# Patient Record
Sex: Male | Born: 1995 | Hispanic: Yes | Marital: Single | Smoking: Never smoker
Health system: Southern US, Community
[De-identification: ages and names within clinical notes are randomized; demographics above are authoritative.]

## PROBLEM LIST (undated history)

## (undated) HISTORY — PX: MENISCUS REPAIR: SHX5179

---
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
## 2014-05-20 ENCOUNTER — Emergency Department: Payer: Self-pay | Admitting: Emergency Medicine

## 2015-02-17 ENCOUNTER — Emergency Department: Payer: Self-pay | Admitting: Emergency Medicine

## 2015-03-31 NOTE — Op Note (Signed)
PATIENT NAME:  Gregory StandsCALVO, Gregory Mullins MR#:  [id number] DATE OF BIRTH:  1996/06/28  DATE OF PROCEDURE:  01/14/2013  PREOPERATIVE DIAGNOSIS:  Bucket handle tear, lateral meniscus, right knee.  POSTOPERATIVE DIAGNOSIS:  Bucket handle tear, lateral meniscus, right knee.   PROCEDURE PERFORMED:  Arthroscopic lateral meniscal repair.   SURGEON:  Murlean HarkShalini Caileigh Canche, M.D.   ASSISTANT:  None.   TOURNIQUET TIME:  0.   ANESTHESIA:  General.   COMPLICATIONS:  No immediate postoperative complications were noted.   DISPOSITION:  Stable.  Transferred to the postoperative care unit.  Nonweightbearing.  Brace locked in extension.   FINDINGS:  Arthroscopic evaluation of the right knee noted excellent cartilage preservation in the patellofemoral joint and the medial compartment.  There was very mild cartilage wear in the lateral compartment directly in line with the split portion of the bucket handle meniscus tear.  The lateral meniscus had a large bucket handle meniscal tear extending from the posterior horn laterally two-thirds of the way around the meniscus.  The meniscus was perched in the femoral notch.   INDICATIONS FOR PROCEDURE:  The patient is a 19 year old young man who was playing with his dog several weeks ago and sustained a twisting injury.  He states that his knee swelled immediately and he was unable to straighten it all the way.  He presented to my office three weeks later for intervention.  An MRI was performed revealing that the patient did have a bucket handle meniscus tear of the lateral meniscus with little residual meniscus left at the periphery.  The tear appeared to be in the red-red zone on the MRI.  Discussion was had with the patient  and his mother about surgical intervention.  Risks and benefits of surgery including the possibility of failure to heal through the meniscal repair were explained.  Expected course of rehabilitation and need for complete compliance was explained.  They agreed and  decided to proceed with surgical intervention.   DESCRIPTION OF PROCEDURE:  The patient is a 19 year old gentleman.  He was identified in the preoperative holding area.  His right lower extremity was marked as the operative site.   The patient was brought into the operating room and placed on the table in a supine position.  General anesthesia was administered.  Right lower extremity was prepared and draped in the usual sterile fashion.  Surgical timeout was performed identifying the patient, procedure, confirming consent, confirming images, and confirming that preoperative antibiotics had been administered.   Patella and patella tendon were marked.  A standard lateral viewing portal was made.  An arthroscope was inserted into the patellofemoral compartment.  The patella tracked centrally within the trochlear groove.  There was no evidence of degenerative changes.  There was no evidence of a synovial fold or large plica.  The medial and lateral gutters were examined and no loose bodies were found.   The arthroscope was now inserted into the medial compartment.  Under direct visualization a medial portal was made.  A probe was inserted through the medial portal and the medial meniscus was probed.  It was found to be quite stable with no evidence of any tears.  The femoral condyle and tibial plateau in the medial compartment were without evidence of cartilage lesions.   The attention is now turned to the notch.  ACL and PCL were identified.  They were probed and found to be quite stable.   Attention is now turned to the lateral compartment.  Blocking entry into the lateral  compartment was the posterior horn and the body of the lateral meniscus.  This had flipped into the lateral aspect of the notch.  Using a blunt trocar through the medial portal, the meniscus is able to be reduced.  The tibial plateau and lateral femoral condyle were examined and there was found to be some wear directly in line with the  area of the bucket handle tear.   The meniscus was again displaced and attention was turned to the capsular attachment.  There was nearly no meniscal tissue left attached in the area of the bucket handle tear.  A meniscal rasp was inserted and the capsular attachment and residual portion of the meniscus were rasped to achieve a bleeding border.  A spinal needle was also used to poke some holes to stimulate further bleeding.  When bleeding was confirmed we proceeded with fixation of the meniscus.   The meniscus easily reduced.  Starting at the posterior aspect and moving laterally towards the body of the meniscus, 5 separate Smith and Nephew fast (Dictation Anomaly) fix 360 meniscal repair tacks were inserted, each placed in a horizontal mattress fashion.  Two sutures were placed on the undersurface of the meniscus and 3 were placed on the top surface of the meniscus.  After placement of all tacks the meniscus was probed along the entire course of the tear and found to be incredibly stable.  At this time the knee was taken through range of motion from 0 to 90 degrees and the meniscus was found to be stable in place.   The knee was drained.  0.25% Marcaine was injected into the portals.  Portals were closed using 3-0 nylon suture.  Sterile dressings were applied.  The patient was placed in a hinged knee brace locked in extension.  TENS unit leads were applied and a Polar Care was applied.   The patient will be nonweightbearing with his leg locked in extension.  He will follow up on Monday.  At the follow-up appointment we will set him up to see physical therapy.  At that time we will begin a rehab protocol which will progress his weight-bearing and his motion.     ____________________________ Murlean Hark, MD sr:ea D: 01/14/2013 15:55:07 ET T: 01/15/2013 02:08:14 ET JOB#: [id number]  cc: Murlean Hark, MD, <Dictator> Murlean Hark MD ELECTRONICALLY SIGNED 02/16/2013 11:46

## 2015-06-09 IMAGING — CT CT ABD-PELV W/O CM
1 of 4 series · 6 of 46 positions shown, 11 images · non-contrast
Comparison: None.

CLINICAL DATA: Dysuria, with mild hematuria, acute onset. Initial
encounter.

EXAM:
CT ABDOMEN AND PELVIS WITHOUT CONTRAST
TECHNIQUE: Multidetector CT imaging of the abdomen and pelvis was performed
following the standard protocol without IV contrast.

[Series 4: lung windows · axial · 0.66mm/px · z∈[-243,-128]mm · 6 of 33 slices shown, 11 images]
[im 5/33  soft-tissue]
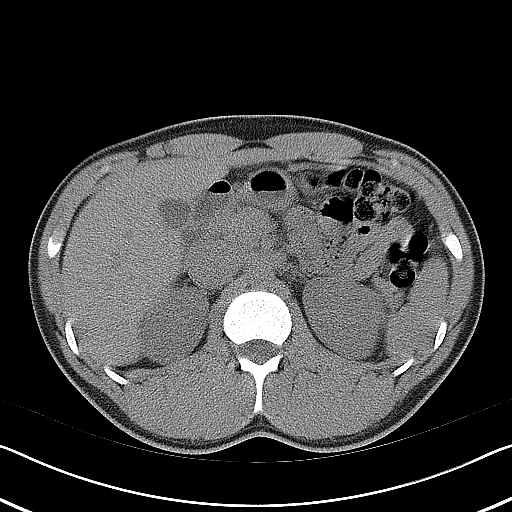
[im 5/33  bone]
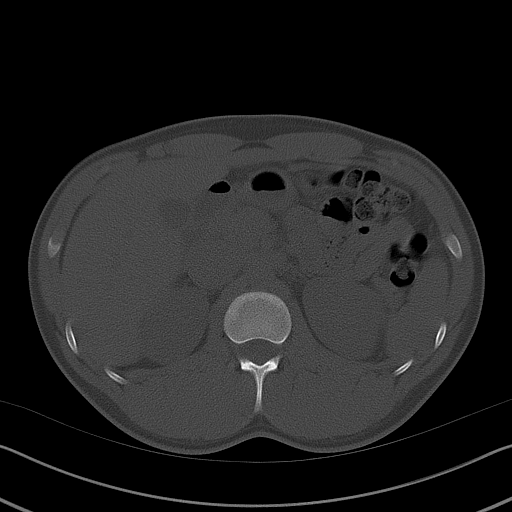
[im 10/33  soft-tissue]
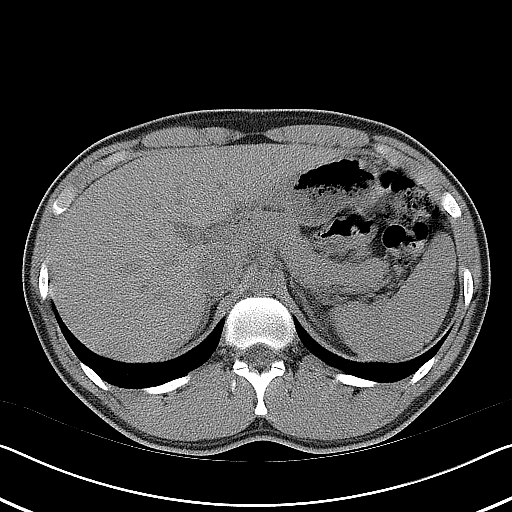
[im 14/33  soft-tissue]
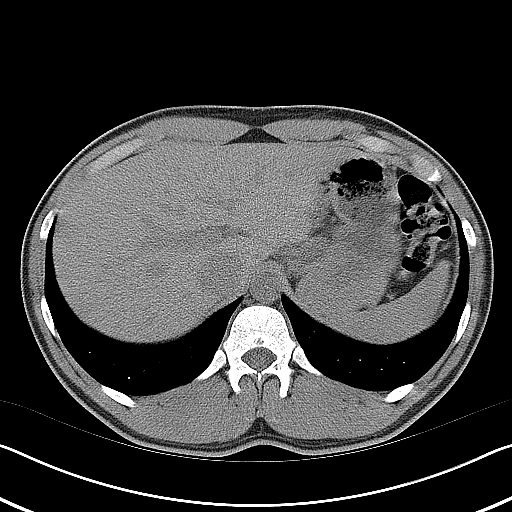
[im 14/33  lung]
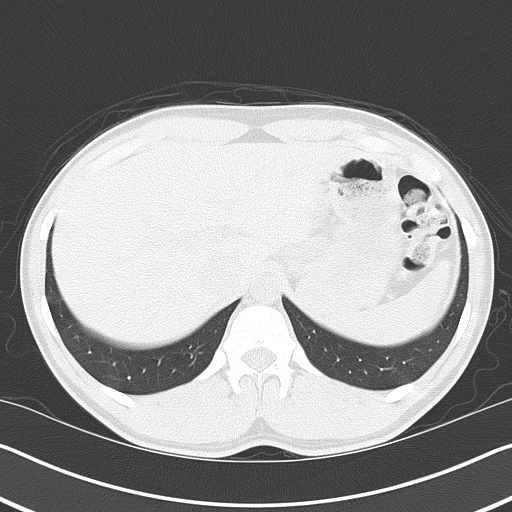
[im 19/33  soft-tissue]
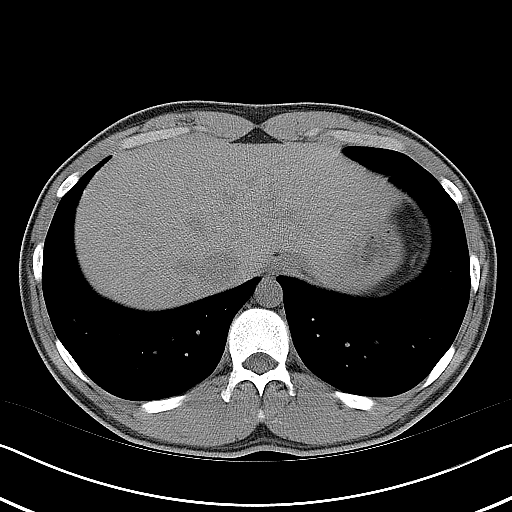
[im 19/33  lung]
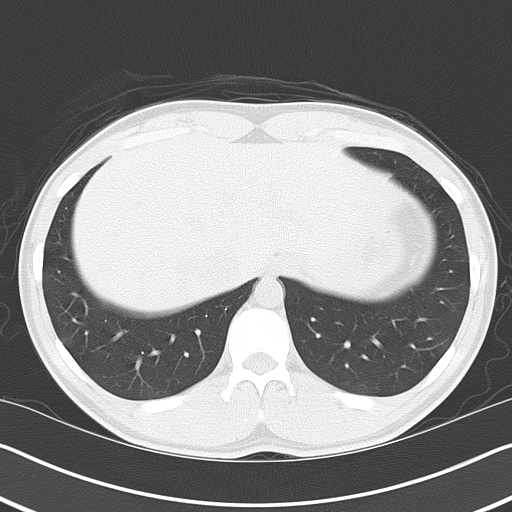
[im 23/33  soft-tissue]
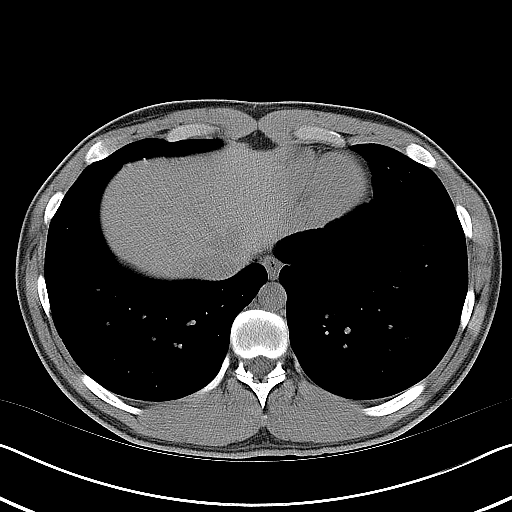
[im 23/33  lung]
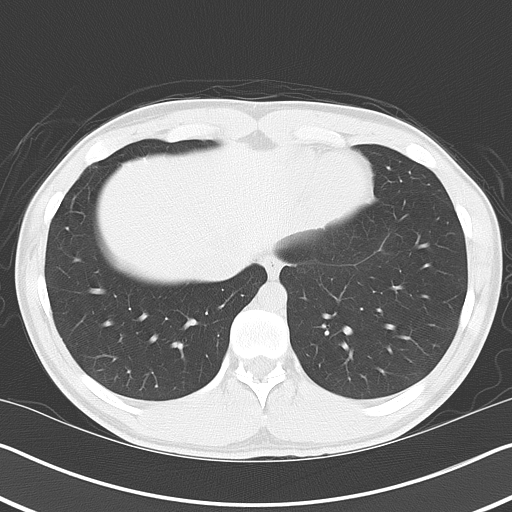
[im 28/33  soft-tissue]
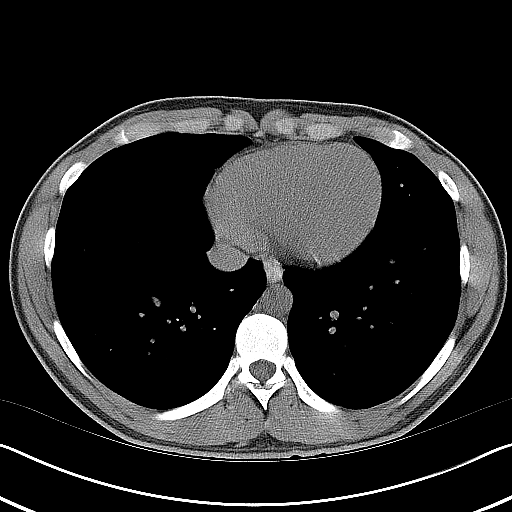
[im 28/33  lung]
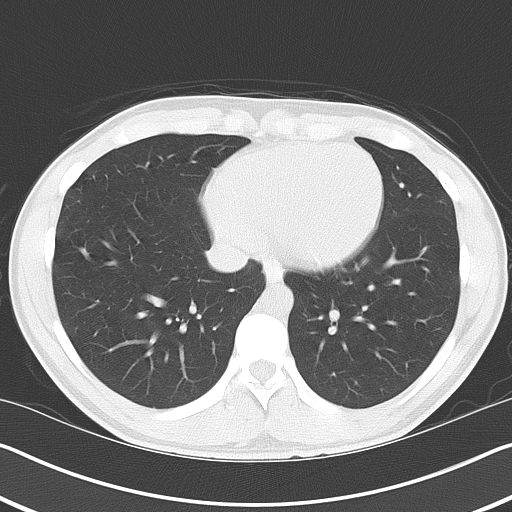

[6 of 46 positions shown; findings below may reference images not displayed]

FINDINGS: The visualized lung bases are clear.

The liver and spleen are unremarkable in appearance. The gallbladder
is within normal limits. The pancreas and adrenal glands are
unremarkable.

The kidneys are unremarkable in appearance. There is no evidence of
hydronephrosis. No renal or ureteral stones are seen. No perinephric
stranding is appreciated.

No free fluid is identified. The small bowel is unremarkable in
appearance. The stomach is within normal limits. No acute vascular
abnormalities are seen.

The appendix is noted on coronal images and is unremarkable in
appearance, without evidence for appendicitis. The colon is
unremarkable in appearance.

The bladder is mildly distended and grossly unremarkable. The
prostate is borderline normal in size. No inguinal lymphadenopathy
is seen.

No acute osseous abnormalities are identified.
IMPRESSION: Unremarkable noncontrast CT of the abdomen and pelvis.

## 2017-05-09 ENCOUNTER — Emergency Department: Payer: Self-pay

## 2017-05-09 ENCOUNTER — Emergency Department
Admission: EM | Admit: 2017-05-09 | Discharge: 2017-05-09 | Disposition: A | Discharge status: Home / Self Care | Payer: Self-pay | Attending: Emergency Medicine | Admitting: Emergency Medicine

## 2017-05-09 ENCOUNTER — Encounter: Payer: Self-pay | Admitting: Emergency Medicine

## 2017-05-09 DIAGNOSIS — Y998 Other external cause status: Secondary | ICD-10-CM | POA: Insufficient documentation

## 2017-05-09 DIAGNOSIS — Y929 Unspecified place or not applicable: Secondary | ICD-10-CM | POA: Insufficient documentation

## 2017-05-09 DIAGNOSIS — Y9389 Activity, other specified: Secondary | ICD-10-CM | POA: Insufficient documentation

## 2017-05-09 DIAGNOSIS — S0502XA Injury of conjunctiva and corneal abrasion without foreign body, left eye, initial encounter: Secondary | ICD-10-CM | POA: Insufficient documentation

## 2017-05-09 DIAGNOSIS — T1592XA Foreign body on external eye, part unspecified, left eye, initial encounter: Secondary | ICD-10-CM

## 2017-05-09 DIAGNOSIS — X58XXXA Exposure to other specified factors, initial encounter: Secondary | ICD-10-CM | POA: Insufficient documentation

## 2017-05-09 MED ORDER — FLUORESCEIN SODIUM 0.6 MG OP STRP
1.0000 | ORAL_STRIP | Freq: Once | OPHTHALMIC | Status: AC
  Administered 2017-05-09: 1 via OPHTHALMIC
  Filled 2017-05-09: qty 1

## 2017-05-09 MED ORDER — OFLOXACIN 0.3 % OP SOLN: 1.0000 [drp] | Freq: Four times a day (QID) | OPHTHALMIC | 0 refills | Status: AC

## 2017-05-09 MED ORDER — KETOROLAC TROMETHAMINE 0.4 % OP SOLN: 1.0000 [drp] | Freq: Four times a day (QID) | OPHTHALMIC | 0 refills | Status: DC

## 2017-05-09 MED ORDER — TETRACAINE HCL 0.5 % OP SOLN
1.0000 [drp] | Freq: Once | OPHTHALMIC | Status: AC
  Administered 2017-05-09: 1 [drp] via OPHTHALMIC
  Filled 2017-05-09: qty 2

## 2017-05-09 NOTE — ED Triage Notes (Addendum)
States was working yesterday at [employer], and screwing in metal screws when a piece of metal got into left eye.  C/O discomfort when blinking.  Sclera reddened to left eye.  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

## 2017-05-09 NOTE — ED Provider Notes (Addendum)
[facility] Emergency Department Provider Note   ____________________________________________   I have reviewed the triage vital signs and the nursing notes.   HISTORY  Chief Complaint Eye Injury    HPI Gregory Mullins is a 21 y.o. male presents with left eye redness and difficulty opening his eye since yesterday when he was attempting to screw a metal screw into a metal frame. He reports possibly metal shavings going into his eye. Patient reports normal visual acuity with irritation and a sense of something in his eye since the injury. He notes some light sensitivity. Patient denies a past history of foreign bodies in the eye.   History reviewed. No pertinent past medical history.  There are no active problems to display for this patient.   History reviewed. No pertinent surgical history.  Prior to Admission medications   Medication Sig Start Date End Date Taking? Authorizing Provider  ketorolac (ACULAR) 0.4 % SOLN Place 1 drop into the left eye 4 (four) times daily. 05/09/17   Mallory Enriques M, PA-C  ofloxacin (OCUFLOX) 0.3 % ophthalmic solution Place 1 drop into the left eye 4 (four) times daily. 05/09/17 05/16/17  Cordaro Mukai, Jordan Likes, PA-C    Allergies Patient has no known allergies.  No family history on file.  Social History Social History  Substance Use Topics  . Smoking status: Never Smoker  . Smokeless tobacco: Never Used  . Alcohol use Not on file    Review of Systems Constitutional: Negative for fever/chills Eyes: Left eye redness, Mild watery eye, sense of foreign body in eye. Light sensitivity in the left eye. Cardiovascular: Denies chest pain. Respiratory: Denies cough Denies shortness of breath. Skin: Negative for rash. Neurological: Negative for headaches.  ____________________________________________   PHYSICAL EXAM:  VITAL SIGNS: ED Triage Vitals  Enc Vitals Group     BP 05/09/17 1550 113/63     Pulse Rate 05/09/17 1550 60    Resp 05/09/17 1550 16     Temp 05/09/17 1550 98.2 F (36.8 C)     Temp Source 05/09/17 1550 Oral     SpO2 05/09/17 1550 99 %     Weight 05/09/17 1551 180 lb (81.6 kg)     Height 05/09/17 1551 5\' 10"  (1.778 m)     Head Circumference --      Peak Flow --      Pain Score 05/09/17 1550 6     Pain Loc --      Pain Edu? --      Excl. in GC? --     Constitutional: Alert and oriented. Well appearing and in no acute distress.  Head: Normocephalic and atraumatic. Eyes: Subconjunctival hemorrhage. PERRL. Normal extraocular movements. Normal visual acuity. Light sensitivity. Cardiovascular: Normal rate, regular rhythm. Normal distal pulses. Respiratory: Normal respiratory effort.  Neurologic: Normal speech and language. No gross focal neurologic deficits are appreciated. Skin:  Skin is warm, dry and intact. No rash noted. Psychiatric: Mood and affect are normal.  ____________________________________________   LABS (all labs ordered are listed, but only abnormal results are displayed)  Labs Reviewed - No data to display ____________________________________________  EKG none ____________________________________________  RADIOLOGY  FINDINGS: There is no evidence of metallic foreign body within the orbits. No significant bone abnormality identified.  IMPRESSION: No evidence of metallic foreign body within the orbits. ____________________________________________   PROCEDURES  Procedure(s) performed:  Fluorescein stain eye exam performed following physical exam:  Performed by: Clois Comber Authorized by: Clois Comber Consent: Verbal consent  obtained. Risks and benefits: risks, benefits and alternatives were discussed Consent given by: patient Irrigated Left eye with Eye Stream eye wash.  (1) drop Tetracaine instilled followed by instillation of fluorescein dye with fluorescein strip.  Examination with [employer] slit lamp performed: Small corneal abrasion noted at 3:00  position of left eye. No other defects noted.  Following the exam:  Left eye irrigated with Eye Stream and (1) drop of Tetracaine instilled.   Patient tolerance: Patient tolerated the procedure well with no immediate complications    Critical Care performed: no ____________________________________________   INITIAL IMPRESSION / ASSESSMENT AND PLAN / ED COURSE  Pertinent labs & imaging results that were available during my care of the patient were reviewed by me and considered in my medical decision making (see chart for details).  Patient presented with subconjunctival hemorrhage, eye watering and irritation secondary to possible foreign body in the left eye. Physical exam findings, fluorescein staining and imaging were reassuring of no foreign bodies. Fluorescein staining visualized small corneal abrasion approximately 3:00. Patient will be prescribed Ocuflox and Acular eyedrops. He be given a referral to follow up with ophthalmology. Patient / Family informed of clinical course, understand medical decision-making process, and agree with plan.  Patient was advised to follow up with ophthalmology and was also advised to return to the emergency department for symptoms that change or worsen.      ____________________________________________   FINAL CLINICAL IMPRESSION(S) / ED DIAGNOSES  Final diagnoses:  Abrasion of left cornea, initial encounter  Foreign body, eye, left, initial encounter       NEW MEDICATIONS STARTED DURING THIS VISIT:  Discharge Medication List as of 05/09/2017  6:05 PM    START taking these medications   Details  ketorolac (ACULAR) 0.4 % SOLN Place 1 drop into the left eye 4 (four) times daily., Starting Fri 05/09/2017, Print    ofloxacin (OCUFLOX) 0.3 % ophthalmic solution Place 1 drop into the left eye 4 (four) times daily., Starting Fri 05/09/2017, Until Fri 05/16/2017, Print         Note:  This document was prepared using Dragon voice recognition  software and may include unintentional dictation errors.   Maurio Baize, Karl Pockraci M, PA-C 05/09/17 2200    Jene EveryKinner, Robert, MD 05/09/17 2233    Clois ComberLittle, Jerrilynn Mikowski M, PA-C 05/09/17 [place]le [id number]    Jene EveryKinner, Robert, MD 05/09/17 2240

## 2018-12-05 ENCOUNTER — Other Ambulatory Visit: Payer: Self-pay

## 2018-12-05 ENCOUNTER — Emergency Department: Payer: Self-pay

## 2018-12-05 ENCOUNTER — Encounter: Payer: Self-pay | Admitting: Emergency Medicine

## 2018-12-05 DIAGNOSIS — J40 Bronchitis, not specified as acute or chronic: Secondary | ICD-10-CM | POA: Insufficient documentation

## 2018-12-05 DIAGNOSIS — H6993 Unspecified Eustachian tube disorder, bilateral: Secondary | ICD-10-CM | POA: Insufficient documentation

## 2018-12-05 NOTE — ED Triage Notes (Signed)
Pt reports 5 days of productive cough, body aches and headache; says he's had pneumonia several times; pt says a few days ago he had a sore throat but that seems to be a little better; pt says he's taken theraflu, mucinex, and other otc meds with no relief; pt talking in complete coherent sentences;

## 2018-12-06 ENCOUNTER — Emergency Department
Admission: EM | Admit: 2018-12-06 | Discharge: 2018-12-06 | Disposition: A | Discharge status: Home / Self Care | Payer: Self-pay | Attending: Emergency Medicine | Admitting: Emergency Medicine

## 2018-12-06 DIAGNOSIS — H6983 Other specified disorders of Eustachian tube, bilateral: Secondary | ICD-10-CM

## 2018-12-06 DIAGNOSIS — J40 Bronchitis, not specified as acute or chronic: Secondary | ICD-10-CM

## 2018-12-06 LAB — GROUP A STREP BY PCR: GROUP A STREP BY PCR: NOT DETECTED

## 2018-12-06 MED ORDER — HYDROCOD POLST-CPM POLST ER 10-8 MG/5ML PO SUER
5.0000 mL | Freq: Once | ORAL | Status: AC
  Administered 2018-12-06: 5 mL via ORAL
  Filled 2018-12-06: qty 5

## 2018-12-06 MED ORDER — AZITHROMYCIN 250 MG PO TABS: ORAL_TABLET | ORAL | 0 refills | Status: AC

## 2018-12-06 MED ORDER — HYDROCOD POLST-CPM POLST ER 10-8 MG/5ML PO SUER: 5.0000 mL | Freq: Two times a day (BID) | ORAL | 0 refills | Status: AC

## 2018-12-06 MED ORDER — METHYLPREDNISOLONE 4 MG PO TBPK: ORAL_TABLET | ORAL | 0 refills | Status: AC

## 2018-12-06 MED ORDER — PREDNISONE 20 MG PO TABS
40.0000 mg | ORAL_TABLET | Freq: Once | ORAL | Status: AC
  Administered 2018-12-06: 40 mg via ORAL
  Filled 2018-12-06: qty 2

## 2018-12-06 NOTE — ED Provider Notes (Signed)
[facility] Emergency Department Provider Note   ____________________________________________   First MD Initiated Contact with Patient 12/06/18 0249     (approximate)  I have reviewed the triage vital signs and the nursing notes.   HISTORY  Chief Complaint Cough; Generalized Body Aches; and Headache    HPI Gregory Mullins is a 22 y.o. male who presents to the ED from home with a chief complaint of flulike illness.  Patient reports a 5-day history of cough productive of green sputum, body aches, frontal headache, nasal congestion, sore throat and myalgias.  States he has had pneumonia twice previously.  Has taken several over-the-counter medications without relief of symptoms. + Sick contacts.  Denies chest pain, shortness of breath, abdominal pain, dysuria, nausea, vomiting, diarrhea.  Denies recent travel or trauma.   Past medical history None  There are no active problems to display for this patient.   Past Surgical History:  Procedure Laterality Date  . MENISCUS REPAIR Right     Prior to Admission medications   Not on File    Allergies Patient has no known allergies.  History reviewed. No pertinent family history.  Social History Social History   Tobacco Use  . Smoking status: Never Smoker  . Smokeless tobacco: Never Used  Substance Use Topics  . Alcohol use: Never    Frequency: Never  . Drug use: Never  Denies vaping  Review of Systems  Constitutional: Positive for myalgias. Eyes: No visual changes. ENT: Positive for nasal congestion and sore throat. Cardiovascular: Denies chest pain. Respiratory: Positive for productive cough.  Denies shortness of breath. Gastrointestinal: No abdominal pain.  No nausea, no vomiting.  No diarrhea.  No constipation. Genitourinary: Negative for dysuria. Musculoskeletal: Negative for back pain. Skin: Negative for rash. Neurological: Negative for headaches, focal weakness or  numbness.   ____________________________________________   PHYSICAL EXAM:  VITAL SIGNS: ED Triage Vitals  Enc Vitals Group     BP 12/05/18 2331 137/70     Pulse Rate 12/05/18 2331 90     Resp 12/05/18 2331 18     Temp 12/05/18 2331 98.6 F (37 C)     Temp Source 12/05/18 2331 Oral     SpO2 12/05/18 2331 98 %     Weight 12/05/18 2332 200 lb (90.7 kg)     Height 12/05/18 2332 5\' 11"  (1.803 m)     Head Circumference --      Peak Flow --      Pain Score 12/05/18 2332 9     Pain Loc --      Pain Edu? --      Excl. in GC? --     Constitutional: Alert and oriented. Well appearing and in no acute distress. Eyes: Conjunctivae are normal. PERRL. EOMI. Head: Atraumatic. Ears: Bilateral TMs bulging with fluid; otherwise unremarkable. Nose: Congestion/rhinnorhea. Mouth/Throat: Mucous membranes are moist.  Oropharynx mildly erythematous without tonsillar swelling, exudates or peritonsillar abscess.  There is no hoarse or muffled voice.  There is no drooling. Neck: No stridor.  Supple neck without meningismus. Cardiovascular: Normal rate, regular rhythm. Grossly normal heart sounds.  Good peripheral circulation. Respiratory: Normal respiratory effort.  No retractions. Lungs CTAB. Gastrointestinal: Soft and nontender. No distention. No abdominal bruits. No CVA tenderness. Musculoskeletal: No lower extremity tenderness nor edema.  No joint effusions. Neurologic:  Normal speech and language. No gross focal neurologic deficits are appreciated. No gait instability. Skin:  Skin is warm, dry and intact. No rash noted.  No petechiae.  Psychiatric: Mood and affect are normal. Speech and behavior are normal.  ____________________________________________   LABS (all labs ordered are listed, but only abnormal results are displayed)  Labs Reviewed  GROUP A STREP BY PCR   ____________________________________________  EKG  None ____________________________________________  RADIOLOGY  ED  MD interpretation: No acute cardiopulmonary process  Official radiology report(s): Dg Chest 2 View  Result Date: 12/05/2018 CLINICAL DATA:  Cough for 5 days EXAM: CHEST - 2 VIEW COMPARISON:  May 20, 2014 FINDINGS: The heart size and mediastinal contours are within normal limits. Both lungs are clear. The visualized skeletal structures are unremarkable. IMPRESSION: No active cardiopulmonary disease. Electronically Signed   By: Sherian ReinWei-Chen  Lin M.D.   On: 12/05/2018 23:59    ____________________________________________   PROCEDURES  Procedure(s) performed: None  Procedures  Critical Care performed: No  ____________________________________________   INITIAL IMPRESSION / ASSESSMENT AND PLAN / ED COURSE  As part of my medical decision making, I reviewed the following data within the electronic MEDICAL RECORD NUMBER History obtained from family, Nursing notes reviewed and incorporated, Labs reviewed, Old chart reviewed and Notes from prior ED visits   22 year old male who presents with flulike symptoms.  Rapid strep is negative.  He is out of the window of Tamiflu.  Will start Medrol Dosepak for eustachian tube dysfunction, Tussionex for cough.  Will provide prescription for Z-Pak and instructed patient to start in 48 hours if he is not feeling better.  Strict return precautions given.  Patient and family member verbalize understanding and agree with plan of care.      ____________________________________________   FINAL CLINICAL IMPRESSION(S) / ED DIAGNOSES  Final diagnoses:  Bronchitis  Dysfunction of both eustachian tubes     ED Discharge Orders    None       Note:  This document was prepared using Dragon voice recognition software and may include unintentional dictation errors.    Irean HongSung, Jade J, MD 12/06/18 [phone]

## 2018-12-06 NOTE — Discharge Instructions (Addendum)
1.  Take steroid taper as prescribed (Medrol Dosepak). 2.  You may take cough medicine as needed (Tussionex). 3.  You may start Z-Pak if you are not feeling better in 2 days. 4.  Return to the ER for worsening symptoms, persistent vomiting, difficulty breathing or other concerns.

## 2021-05-01 ENCOUNTER — Emergency Department: Payer: Self-pay

## 2021-05-01 ENCOUNTER — Other Ambulatory Visit: Payer: Self-pay

## 2021-05-01 DIAGNOSIS — S4992XA Unspecified injury of left shoulder and upper arm, initial encounter: Secondary | ICD-10-CM | POA: Insufficient documentation

## 2021-05-01 DIAGNOSIS — W01198A Fall on same level from slipping, tripping and stumbling with subsequent striking against other object, initial encounter: Secondary | ICD-10-CM | POA: Insufficient documentation

## 2021-05-01 DIAGNOSIS — M25422 Effusion, left elbow: Secondary | ICD-10-CM | POA: Insufficient documentation

## 2021-05-01 NOTE — ED Triage Notes (Signed)
Pt states he fell tonight off a table and landed on right arm, pt c/o left forearm pain. Pt has swelling and bruising to arm. No pain to shoulder

## 2021-05-02 ENCOUNTER — Emergency Department
Admission: EM | Admit: 2021-05-02 | Discharge: 2021-05-02 | Disposition: A | Discharge status: Home / Self Care | Payer: Self-pay | Attending: Emergency Medicine | Admitting: Emergency Medicine

## 2021-05-02 DIAGNOSIS — M25422 Effusion, left elbow: Secondary | ICD-10-CM

## 2021-05-02 NOTE — Discharge Instructions (Addendum)
Take Tylenol 1 g every 8 hours and ibuprofen 600 every 6 hours with food.  Do not use the arm until you get follow-up with orthopedic for repeat xray.    IMPRESSION:  1. Trace left elbow effusion with no definite acute displaced  fracture or dislocation of the bones of the left elbow. An occult  fracture is likely present. Consider repeat x-ray in 7-10 days.  2. No acute displaced fracture or dislocation of the left wrist.

## 2021-05-02 NOTE — ED Provider Notes (Signed)
[facility] Emergency Department Provider Note  ____________________________________________   Event Date/Time   First MD Initiated Contact with Patient 05/02/21 0200     (approximate)  I have reviewed the triage vital signs and the nursing notes.   HISTORY  Chief Complaint Arm Injury    HPI Gregory Mullins is a 25 y.o. male otherwise healthy comes in for a fall.  Patient fell tonight off of a table and landed onto his left arm.  Patient states that was about 2 feet above the ground.  He states it happened 2 days ago.  He states that he did not hit anything else, did not lose consciousness.  Patient reports a lot of swelling, pain and bruising to the left elbow.  No snuffbox tenderness.  Pain is constant, worse with movement, better at rest.            History reviewed. No pertinent past medical history.  There are no problems to display for this patient.   Past Surgical History:  Procedure Laterality Date  . MENISCUS REPAIR Right     Prior to Admission medications   Medication Sig Start Date End Date Taking? Authorizing Provider  azithromycin (ZITHROMAX Z-PAK) 250 MG tablet 2 tablets day #1, Then 1 tablet daily until finished 12/06/18   Irean Hong, MD  chlorpheniramine-HYDROcodone [facility]) 10-8 MG/5ML SUER Take 5 mLs by mouth 2 (two) times daily. 12/06/18   Irean Hong, MD  methylPREDNISolone (MEDROL DOSEPAK) 4 MG TBPK tablet Take as directed 12/06/18   Irean Hong, MD    Allergies Patient has no known allergies.  No family history on file.  Social History Social History   Tobacco Use  . Smoking status: Never Smoker  . Smokeless tobacco: Never Used  Substance Use Topics  . Alcohol use: Never  . Drug use: Never      Review of Systems Constitutional: No fever/chills Eyes: No visual changes. ENT: No sore throat. Cardiovascular: Denies chest pain. Respiratory: Denies shortness of breath. Gastrointestinal: No  abdominal pain.  No nausea, no vomiting.  No diarrhea.  No constipation. Genitourinary: Negative for dysuria. Musculoskeletal: Negative for back pain.  Left arm pain Skin: Negative for rash. Neurological: Negative for headaches, focal weakness or numbness. All other ROS negative ____________________________________________   PHYSICAL EXAM:  VITAL SIGNS: ED Triage Vitals  Enc Vitals Group     BP 05/01/21 2304 138/73     Pulse Rate 05/01/21 2304 73     Resp 05/01/21 2304 18     Temp 05/01/21 2304 98.1 F (36.7 C)     Temp src --      SpO2 05/01/21 2304 99 %     Weight 05/01/21 2304 200 lb (90.7 kg)     Height 05/01/21 2304 6' (1.829 m)     Head Circumference --      Peak Flow --      Pain Score 05/01/21 2303 8     Pain Loc --      Pain Edu? --      Excl. in GC? --     Constitutional: Alert and oriented. Well appearing and in no acute distress. Eyes: Conjunctivae are normal. EOMI. Head: Atraumatic. Nose: No congestion/rhinnorhea. Mouth/Throat: Mucous membranes are moist.   Neck: No stridor. Trachea Midline. FROM Cardiovascular: Normal rate, regular rhythm. Grossly normal heart sounds.  Good peripheral circulation. Respiratory: Normal respiratory effort.  No retractions. Lungs CTAB. Gastrointestinal: Soft and nontender. No distention. No abdominal bruits.  Musculoskeletal: Pain in the left elbow.  Mostly on the medial side.  Difficulty extending the elbow secondary to pain.  Bruising and swelling noted to the area.  No snuffbox tenderness.  Radial pulse intact.  Sensation intact.  Radial, ulnar, median nerves are intact. Neurologic:  Normal speech and language. No gross focal neurologic deficits are appreciated.  Skin:  Skin is warm, dry and intact. No rash noted. Psychiatric: Mood and affect are normal. Speech and behavior are normal. GU: Deferred   ___RADIOLOGY I, Concha Se, personally viewed and evaluated these images (plain radiographs) as part of my medical decision  making, as well as reviewing the written report by the radiologist.  ED MD interpretation: Effusion to the left elbow  Official radiology report(s): DG Elbow Complete Left  Result Date: 05/01/2021 CLINICAL DATA:  Status post fall. EXAM: LEFT ELBOW - COMPLETE 3+ VIEW; LEFT WRIST - COMPLETE 3+ VIEW COMPARISON:  None. FINDINGS: Left wrist: There is no evidence of fracture, dislocation, or joint effusion. There is no evidence of arthropathy or other focal bone abnormality. Subcutaneus soft tissue edema. Left elbow: No evidence of fracture or dislocation. Tiny posterior elbow fat pad noted. No evidence of severe arthropathy. No aggressive appearing focal bone abnormality. Soft tissues are unremarkable. IMPRESSION: 1. Trace left elbow effusion with no definite acute displaced fracture or dislocation of the bones of the left elbow. An occult fracture is likely present. Consider repeat x-ray in 7-10 days. 2. No acute displaced fracture or dislocation of the left wrist. Electronically Signed   By: Tish Frederickson M.D.   On: 05/01/2021 23:39   DG Wrist Complete Left  Result Date: 05/01/2021 CLINICAL DATA:  Status post fall. EXAM: LEFT ELBOW - COMPLETE 3+ VIEW; LEFT WRIST - COMPLETE 3+ VIEW COMPARISON:  None. FINDINGS: Left wrist: There is no evidence of fracture, dislocation, or joint effusion. There is no evidence of arthropathy or other focal bone abnormality. Subcutaneus soft tissue edema. Left elbow: No evidence of fracture or dislocation. Tiny posterior elbow fat pad noted. No evidence of severe arthropathy. No aggressive appearing focal bone abnormality. Soft tissues are unremarkable. IMPRESSION: 1. Trace left elbow effusion with no definite acute displaced fracture or dislocation of the bones of the left elbow. An occult fracture is likely present. Consider repeat x-ray in 7-10 days. 2. No acute displaced fracture or dislocation of the left wrist. Electronically Signed   By: Tish Frederickson M.D.   On:  05/01/2021 23:39    ____________________________________________   PROCEDURES  Procedure(s) performed (including Critical Care):  Procedures   ____________________________________________   INITIAL IMPRESSION / ASSESSMENT AND PLAN / ED COURSE  Inocencio Cardiff was evaluated in Emergency Department on 05/02/2021 for the symptoms described in the history of present illness. He was evaluated in the context of the global COVID-19 pandemic, which necessitated consideration that the patient might be at risk for infection with the SARS-CoV-2 virus that causes COVID-19. Institutional protocols and algorithms that pertain to the evaluation of patients at risk for COVID-19 are in a state of rapid change based on information released by regulatory bodies including the CDC and federal and state organizations. These policies and algorithms were followed during the patient's care in the ED.    X-ray ordered to evaluate for fracture, dislocation, sprain.  X-ray concerning for elbow effusion and most likely an occult fracture.  Given patient's significant swelling and tenderness I recommended a splint and sling and follow-up with Ortho in 7 to 10 days for repeat x-ray.  No snuffbox tenderness to suggest occult scaphoid fracture.  Given x-ray findings a posterior splint was applied and patient had good circulation after the splint was placed and arm was placed in a sling.  Patient was given orthopedic follow-up and recommended repeat x-ray in 7 to 10 days.  We discussed exercises to help prevent the shoulder from getting frozen.         ____________________________________________   FINAL CLINICAL IMPRESSION(S) / ED DIAGNOSES   Final diagnoses:  Elbow effusion, left      MEDICATIONS GIVEN DURING THIS VISIT:  Medications - No data to display   ED Discharge Orders    None       Note:  This document was prepared using Dragon voice recognition software and may include unintentional  dictation errors.   Concha Se, MD 05/02/21 [phone]

## 2022-07-20 ENCOUNTER — Encounter: Payer: Self-pay | Admitting: Emergency Medicine

## 2022-07-20 ENCOUNTER — Emergency Department: Payer: No Typology Code available for payment source

## 2022-07-20 ENCOUNTER — Other Ambulatory Visit: Payer: Self-pay

## 2022-07-20 ENCOUNTER — Emergency Department
Admission: EM | Admit: 2022-07-20 | Discharge: 2022-07-20 | Disposition: A | Discharge status: Home / Self Care | Payer: No Typology Code available for payment source | Attending: Emergency Medicine | Admitting: Emergency Medicine

## 2022-07-20 DIAGNOSIS — Y92410 Unspecified street and highway as the place of occurrence of the external cause: Secondary | ICD-10-CM | POA: Diagnosis not present

## 2022-07-20 DIAGNOSIS — S4991XA Unspecified injury of right shoulder and upper arm, initial encounter: Secondary | ICD-10-CM | POA: Diagnosis present

## 2022-07-20 DIAGNOSIS — S43101A Unspecified dislocation of right acromioclavicular joint, initial encounter: Secondary | ICD-10-CM | POA: Insufficient documentation

## 2022-07-20 DIAGNOSIS — S161XXA Strain of muscle, fascia and tendon at neck level, initial encounter: Secondary | ICD-10-CM | POA: Insufficient documentation

## 2022-07-20 DIAGNOSIS — M542 Cervicalgia: Secondary | ICD-10-CM | POA: Insufficient documentation

## 2022-07-20 DIAGNOSIS — M25522 Pain in left elbow: Secondary | ICD-10-CM | POA: Insufficient documentation

## 2022-07-20 MED ORDER — IBUPROFEN 600 MG PO TABS: 600.0000 mg | ORAL_TABLET | Freq: Three times a day (TID) | ORAL | 0 refills | Status: AC | PRN

## 2022-07-20 MED ORDER — METHOCARBAMOL 500 MG PO TABS: 500.0000 mg | ORAL_TABLET | Freq: Three times a day (TID) | ORAL | 0 refills | Status: AC | PRN

## 2022-07-20 MED ORDER — IBUPROFEN 800 MG PO TABS
800.0000 mg | ORAL_TABLET | Freq: Once | ORAL | Status: AC
  Administered 2022-07-20: 800 mg via ORAL
  Filled 2022-07-20: qty 1
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
